# Patient Record
Sex: Male | Born: 1970 | Race: Black or African American | Hispanic: No | Marital: Married | Smoking: Never smoker
Health system: Southern US, Community
[De-identification: ages and names within clinical notes are randomized; demographics above are authoritative.]

## PROBLEM LIST (undated history)

## (undated) DIAGNOSIS — I10 Essential (primary) hypertension: Secondary | ICD-10-CM

## (undated) HISTORY — PX: MASS EXCISION: SHX2000

## (undated) HISTORY — PX: VASECTOMY: SHX75

## (undated) HISTORY — DX: Essential (primary) hypertension: I10

---

## 2007-12-21 ENCOUNTER — Ambulatory Visit: Payer: Self-pay | Admitting: Family Medicine

## 2014-07-12 ENCOUNTER — Emergency Department: Payer: Self-pay | Admitting: Internal Medicine

## 2016-05-26 IMAGING — CR DG SHOULDER 3+V*R*
1 series · 3 of 3 positions shown · non-contrast
Comparison: None.

CLINICAL DATA: Right shoulder pain starting [REDACTED], no known injury

EXAM:
DG SHOULDER 3+ VIEWS RIGHT

[Series 1: dxr shoulder right complete · 0.14mm/px · 3 of 3 slices shown]
[im 1/3]
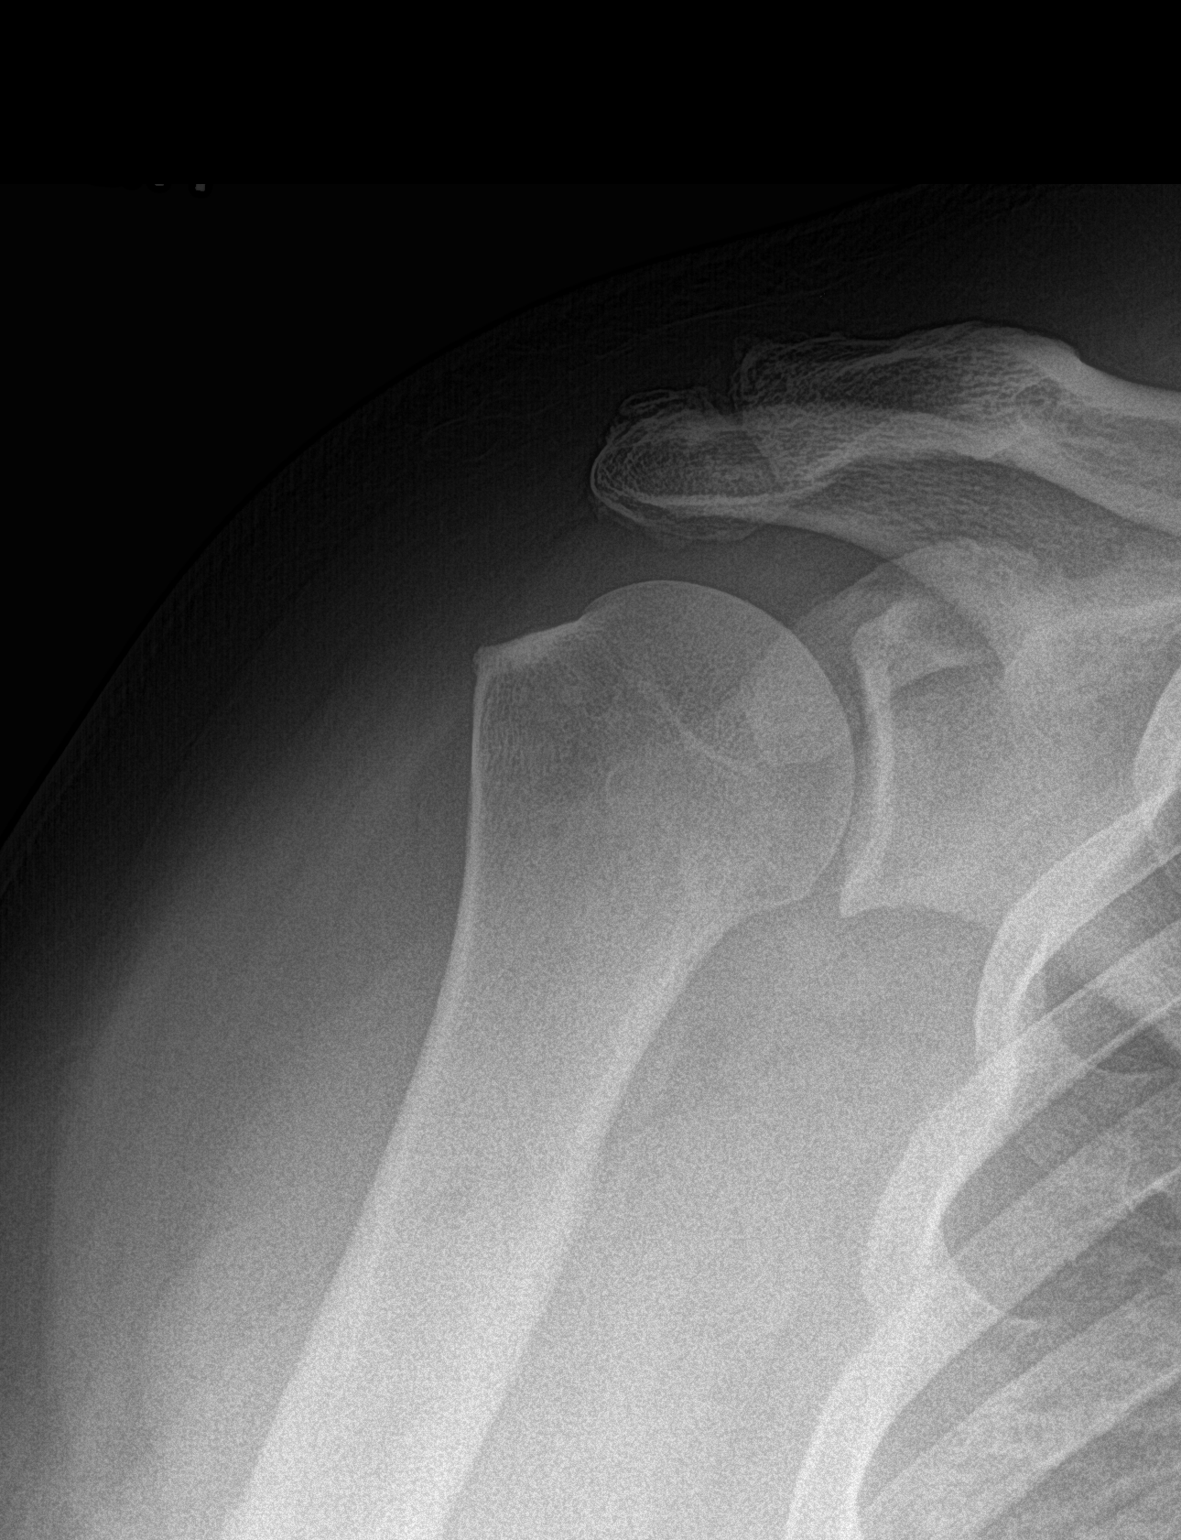
[im 2/3]
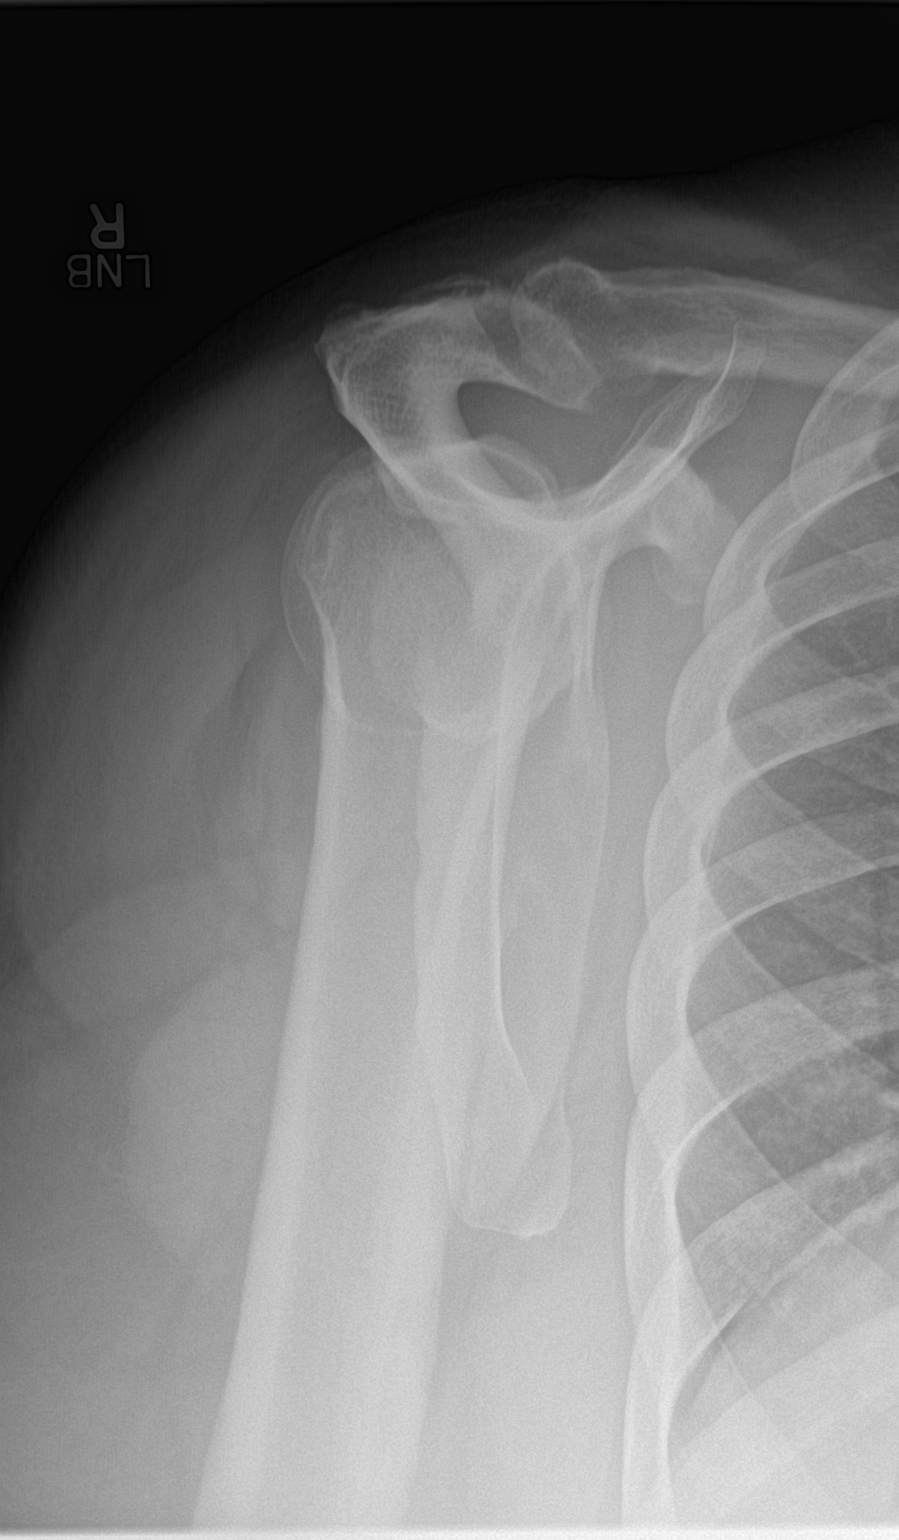
[im 3/3]
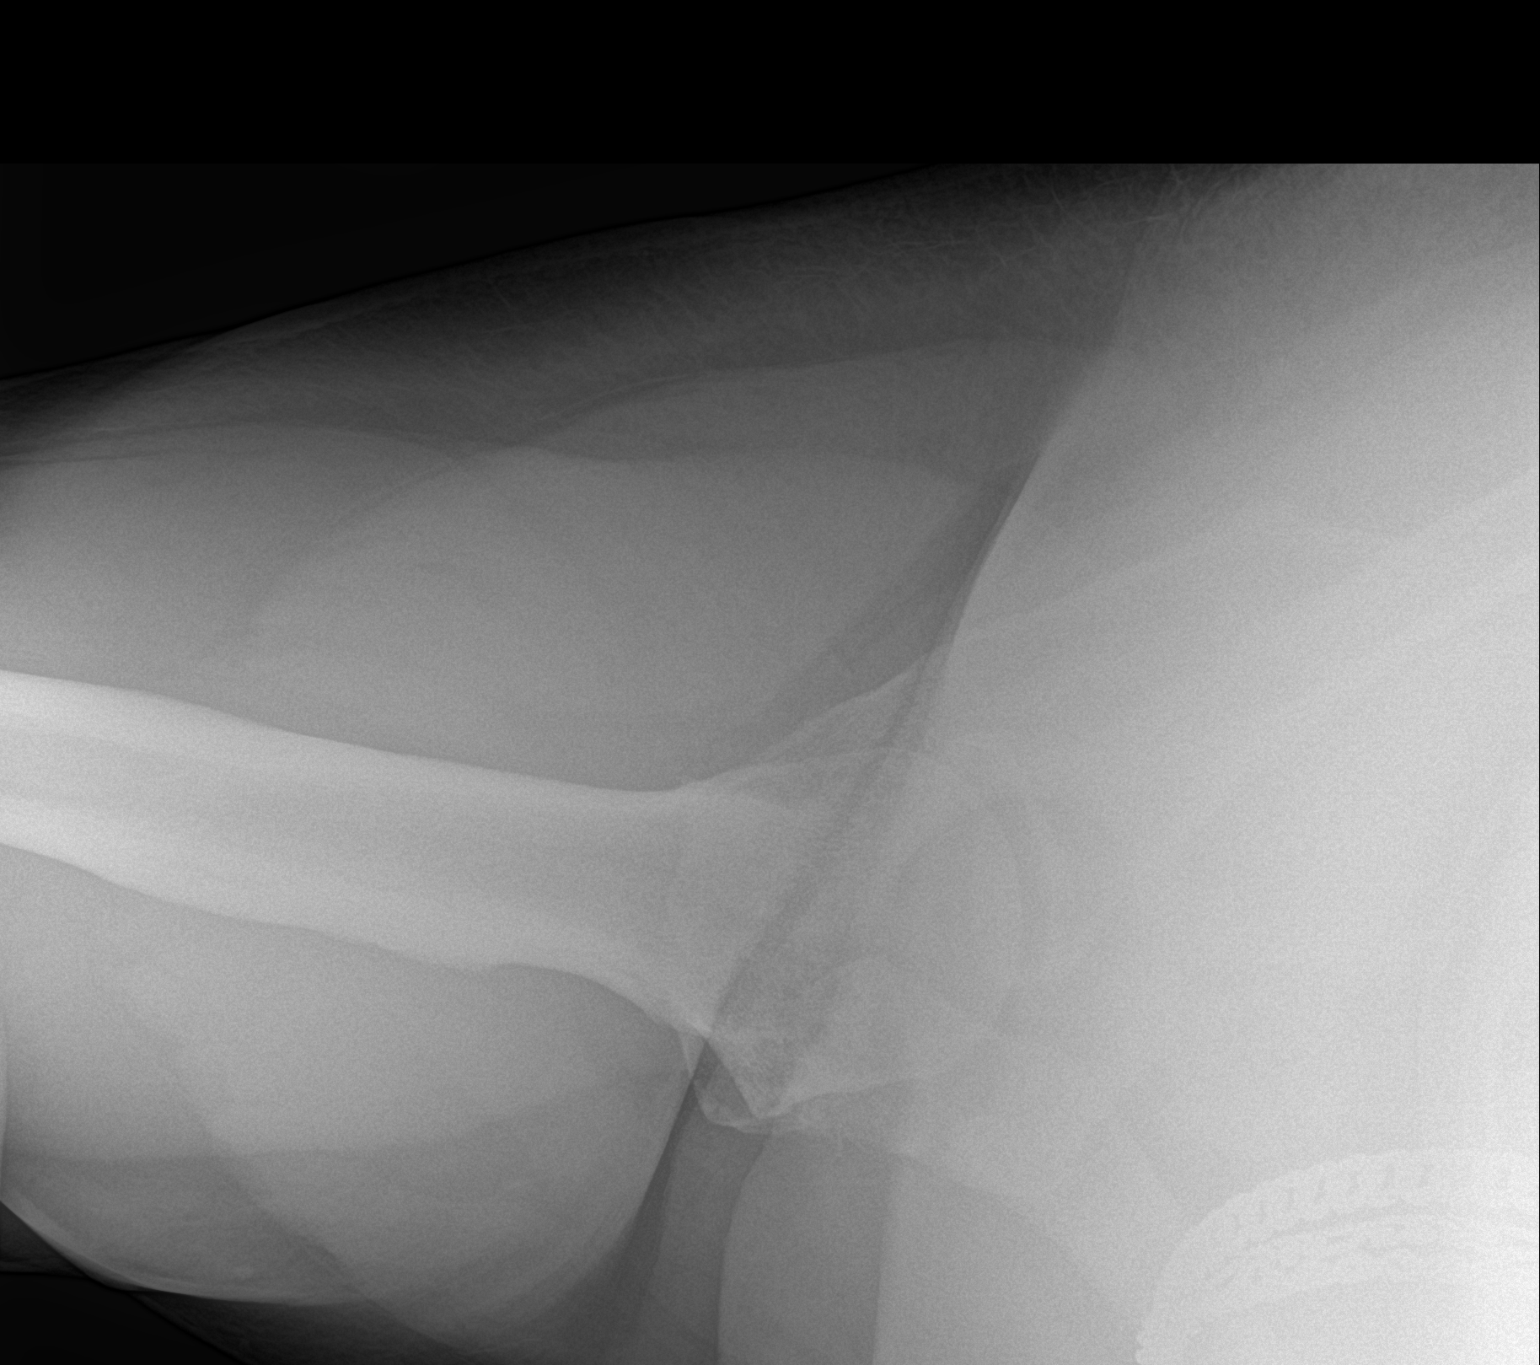

[3 of 3 positions shown; findings below may reference images not displayed]

FINDINGS: Three views of the right shoulder submitted. No acute fracture or
subluxation. Mild degenerative changes acromioclavicular joint.
Glenohumeral joint is preserved. Mild inferior spurring of acromion.
IMPRESSION: No acute fracture or subluxation. Mild degenerative changes as
described above.

## 2017-02-21 DIAGNOSIS — R0789 Other chest pain: Secondary | ICD-10-CM | POA: Insufficient documentation

## 2018-08-14 ENCOUNTER — Encounter: Payer: Self-pay | Admitting: Urology

## 2018-08-14 ENCOUNTER — Ambulatory Visit: Payer: BLUE CROSS/BLUE SHIELD | Admitting: Urology

## 2018-08-14 VITALS — BP 154/83 | HR 70 | Ht 71.0 in | Wt 330.0 lb

## 2018-08-14 DIAGNOSIS — N400 Enlarged prostate without lower urinary tract symptoms: Secondary | ICD-10-CM | POA: Diagnosis not present

## 2018-08-14 DIAGNOSIS — Z125 Encounter for screening for malignant neoplasm of prostate: Secondary | ICD-10-CM

## 2018-08-14 LAB — BLADDER SCAN AMB NON-IMAGING: SCAN RESULT: 46

## 2018-08-14 NOTE — Progress Notes (Signed)
08/14/2018 2:21 PM   Marcus Sullivan Jul 24, 1970 [id number]  Referring provider: Sharyne Peach, MD [place], [place] [address]  Chief Complaint  Patient presents with  . Elevated PSA    HPI:  48 year old male referred for PSA screening.  PSA was 70 in 2015 checked during a UTI.  Follow-up PSA levels have ranged from 1.2-1.99, the patient referred given recent data on increased screening frequency when PSA is over 1.5. His most recent PSA was 1.75.   No FH PCa.   He has occasional frequency. PVR is 42. He drinks a lot of water. Good flow. Noc  0-1. AUASS = 5, mostly satisfied.   Modifying factors: There are no other modifying factors  Associated signs and symptoms: There are no other associated signs and symptoms Aggravating and relieving factors: There are no other aggravating or relieving factors Severity: Moderate Duration: Persistent    PMH: Past Medical History:  Diagnosis Date  . Hypertension     Surgical History:   Home Medications:  Allergies as of 08/14/2018   No Known Allergies     Medication List       Accurate as of August 14, 2018  2:21 PM. Always use your most recent med list.        aspirin EC 81 MG tablet Take by mouth.   Fish Oil 1000 MG Caps Take by mouth.   losartan 100 MG tablet Commonly known as:  COZAAR   vitamin B-12 1000 MCG tablet Commonly known as:  CYANOCOBALAMIN Take by mouth.       Allergies: No Known Allergies  Family History: No family history on file.  Social History:  reports that he has never smoked. He has never used smokeless tobacco. He reports current alcohol use. He reports that he does not use drugs.  ROS: UROLOGY Frequent Urination?: Yes Hard to postpone urination?: No Burning/pain with urination?: No Get up at night to urinate?: No Leakage of urine?: No Urine stream starts and stops?: No Trouble starting stream?: No Do you have to strain to urinate?: No Blood in urine?:  No Urinary tract infection?: No Sexually transmitted disease?: No Injury to kidneys or bladder?: No Painful intercourse?: No Weak stream?: No Erection problems?: No Penile pain?: No  Gastrointestinal Nausea?: No Vomiting?: No Indigestion/heartburn?: No Diarrhea?: No Constipation?: No  Constitutional Fever: No Night sweats?: No Weight loss?: No Fatigue?: No  Skin Skin rash/lesions?: No Itching?: No  Eyes Blurred vision?: No Double vision?: No  Ears/Nose/Throat Sore throat?: No Sinus problems?: No  Hematologic/Lymphatic Swollen glands?: No Easy bruising?: No  Cardiovascular Leg swelling?: No Chest pain?: No  Respiratory Cough?: No Shortness of breath?: No  Endocrine Excessive thirst?: No  Musculoskeletal Back pain?: No Joint pain?: No  Neurological Headaches?: No Dizziness?: No  Psychologic Depression?: No Anxiety?: No  Physical Exam: BP (!) 154/83   Pulse 70   Ht 5\' 11"  (1.803 m)   Wt (!) 149.7 kg   BMI 46.03 kg/m   Constitutional:  Alert and oriented, No acute distress. HEENT: [place] AT, moist mucus membranes.  Trachea midline, no masses. Cardiovascular: No clubbing, cyanosis, or edema. Respiratory: Normal respiratory effort, no increased work of breathing. GI: Abdomen is soft, nontender, nondistended, no abdominal masses GU: No CVA tenderness Lymph: No cervical or inguinal lymphadenopathy. Skin: No rashes, bruises or suspicious lesions. Neurologic: Grossly intact, no focal deficits, moving all 4 extremities. Psychiatric: Normal mood and affect. DRE: prostate 30 g, smooth without hard area or nodule  Laboratory Data: No results found for: WBC, HGB, HCT, MCV, PLT  No results found for: CREATININE  No results found for: PSA  No results found for: TESTOSTERONE  No results found for: HGBA1C  Urinalysis No results found for: COLORURINE, APPEARANCEUR, LABSPEC, PHURINE, GLUCOSEU, HGBUR, BILIRUBINUR, KETONESUR, PROTEINUR, UROBILINOGEN,  NITRITE, LEUKOCYTESUR  No results found for: LABMICR, WBCUA, RBCUA, LABEPIT, MUCUS, BACTERIA  No results found for this or any previous visit. No results found for this or any previous visit. No results found for this or any previous visit. No results found for this or any previous visit. No results found for this or any previous visit. No results found for this or any previous visit. No results found for this or any previous visit. No results found for this or any previous visit.  Assessment & Plan:    1. PSA screening  - overall PSA remains low. Will continue yearly screening.  - Bladder Scan (Post Void Residual) in office  2. BPH - will monitor   No follow-ups on file.  Festus Aloe, MD  [facility] [address] [place], [place] [address] [phone]

## 2019-03-20 ENCOUNTER — Ambulatory Visit: Payer: Self-pay

## 2019-03-20 ENCOUNTER — Other Ambulatory Visit: Payer: Self-pay

## 2019-03-20 VITALS — BP 150/96 | HR 77 | Resp 18 | Ht 72.0 in | Wt 339.0 lb

## 2019-03-20 DIAGNOSIS — Z008 Encounter for other general examination: Secondary | ICD-10-CM

## 2019-03-20 LAB — POCT LIPID PANEL
HDL: 47
LDL: 50
Non-HDL: 68
POC Glucose: 111 mg/dl — AB (ref 70–99)
TC/HDL: 2.5
TC: 115
TRG: 91

## 2019-03-20 NOTE — Progress Notes (Signed)
     Patient ID: Marcus Sullivan, male    DOB: [birth year], 48 y.o.   MRN: [id number]    Thank you!!  Apolonio Schneiders RN  [place] [place]: [phone]  Cell:  [phone] Website: [email or web address]

## 2019-03-28 ENCOUNTER — Other Ambulatory Visit: Payer: Self-pay

## 2019-03-28 ENCOUNTER — Ambulatory Visit: Payer: Self-pay

## 2019-03-28 DIAGNOSIS — Z23 Encounter for immunization: Secondary | ICD-10-CM

## 2019-03-28 NOTE — Progress Notes (Signed)
   Subjective:    Patient ID: Marcus Sullivan, male    DOB: May 08, 1971, 48 y.o.   MRN: [id number]  HPI    Review of Systems     Objective:   Physical Exam  Assessment & Plan:   Wt Readings from Last 3 Encounters:  03/20/19 (!) 339 lb (153.8 kg)  08/14/18 (!) 330 lb (149.7 kg)   Temp Readings from Last 3 Encounters:  No data found for Temp   BP Readings from Last 3 Encounters:  03/20/19 (!) 150/96  08/14/18 (!) 154/83   Pulse Readings from Last 3 Encounters:  03/20/19 77  08/14/18 70     Lab Results  Component Value Date   POCGLU 111 (A) 03/20/2019           Thank you!!  Apolonio Schneiders RN  [place] [facility] [place]: [phone]  Cell:  [phone] Website: [email or web address]

## 2019-03-28 NOTE — Patient Instructions (Signed)
Influenza (Flu) Vaccine (Inactivated or Recombinant): What You Need to Know 1. Why get vaccinated? Influenza vaccine can prevent influenza (flu). Flu is a contagious disease that spreads around the [place] every year, usually between October and May. Anyone can get the flu, but it is more dangerous for some people. Infants and young children, people 50 years of age and older, pregnant women, and people with certain health conditions or a weakened immune system are at greatest risk of flu complications. Pneumonia, bronchitis, sinus infections and ear infections are examples of flu-related complications. If you have a medical condition, such as heart disease, cancer or diabetes, flu can make it worse. Flu can cause fever and chills, sore throat, muscle aches, fatigue, cough, headache, and runny or stuffy nose. Some people may have vomiting and diarrhea, though this is more common in children than adults. Each year thousands of people in the [place] die from flu, and many more are hospitalized. Flu vaccine prevents millions of illnesses and flu-related visits to the doctor each year. 2. Influenza vaccine CDC recommends everyone 6 months of age and older get vaccinated every flu season. Children 6 months through 8 years of age may need 2 doses during a single flu season. Everyone else needs only 1 dose each flu season. It takes about 2 weeks for protection to develop after vaccination. There are many flu viruses, and they are always changing. Each year a new flu vaccine is made to protect against three or four viruses that are likely to cause disease in the upcoming flu season. Even when the vaccine doesn't exactly match these viruses, it may still provide some protection. Influenza vaccine does not cause flu. Influenza vaccine may be given at the same time as other vaccines. 3. Talk with your health care provider Tell your vaccine provider if the person getting the vaccine:  Has had an  allergic reaction after a previous dose of influenza vaccine, or has any severe, life-threatening allergies.  Has ever had Guillain-Barr Syndrome (also called GBS). In some cases, your health care provider may decide to postpone influenza vaccination to a future visit. People with minor illnesses, such as a cold, may be vaccinated. People who are moderately or severely ill should usually wait until they recover before getting influenza vaccine. Your health care provider can give you more information. 4. Risks of a vaccine reaction  Soreness, redness, and swelling where shot is given, fever, muscle aches, and headache can happen after influenza vaccine.  There may be a very small increased risk of Guillain-Barr Syndrome (GBS) after inactivated influenza vaccine (the flu shot). Young children who get the flu shot along with pneumococcal vaccine (PCV13), and/or DTaP vaccine at the same time might be slightly more likely to have a seizure caused by fever. Tell your health care provider if a child who is getting flu vaccine has ever had a seizure. People sometimes faint after medical procedures, including vaccination. Tell your provider if you feel dizzy or have vision changes or ringing in the ears. As with any medicine, there is a very remote chance of a vaccine causing a severe allergic reaction, other serious injury, or death. 5. What if there is a serious problem? An allergic reaction could occur after the vaccinated person leaves the clinic. If you see signs of a severe allergic reaction (hives, swelling of the face and throat, difficulty breathing, a fast heartbeat, dizziness, or weakness), call 9-1-1 and get the person to the nearest hospital. For other signs that   concern you, call your health care provider. Adverse reactions should be reported to the Vaccine Adverse Event Reporting System (VAERS). Your health care provider will usually file this report, or you can do it yourself. Visit the  VAERS website at www.vaers.hhs.gov or call 1-800-822-7967.VAERS is only for reporting reactions, and VAERS staff do not give medical advice. 6. The National Vaccine Injury Compensation Program The National Vaccine Injury Compensation Program (VICP) is a federal program that was created to compensate people who may have been injured by certain vaccines. Visit the VICP website at www.hrsa.gov/vaccinecompensation or call 1-800-338-2382 to learn about the program and about filing a claim. There is a time limit to file a claim for compensation. 7. How can I learn more?  Ask your healthcare provider.  Call your local or state health department.  Contact the Centers for Disease Control and Prevention (CDC): ? Call 1-800-232-4636 (1-800-CDC-INFO) or ? Visit CDC's www.cdc.gov/flu Vaccine Information Statement (Interim) Inactivated Influenza Vaccine (01/31/2018) This information is not intended to replace advice given to you by your health care provider. Make sure you discuss any questions you have with your health care provider. Document Released: 03/30/2006 Document Revised: 09/24/2018 Document Reviewed: 02/04/2018 Elsevier Patient Education  2020 Elsevier Inc. Preventing Influenza, Adult Influenza, more commonly known as "the flu," is a viral infection that mainly affects the respiratory tract. The respiratory tract includes structures that help you breathe, such as the lungs, nose, and throat. The flu causes many common cold symptoms, as well as a high fever and body aches. The flu spreads easily from person to person (is contagious). The flu is most common from December through March. This is called flu season.You can catch the flu virus by:  Breathing in droplets from an infected person's cough or sneeze.  Touching something that was recently contaminated with the virus and then touching your mouth, nose, or eyes. What can I do to lower my risk?        You can decrease your risk of getting  the flu by:  Getting a flu shot (influenza vaccination) every year. This is the best way to prevent the flu. A flu shot is recommended for everyone age 6 months and older. ? It is best to get a flu shot in the fall, as soon as it is available. Getting a flu shot during winter or spring instead is still a good idea. Flu season can last into early spring. ? Preventing the flu through vaccination requires getting a new flu shot every year. This is because the flu virus changes slightly (mutates) from one year to the next. Even if a flu shot does not completely protect you from all flu virus mutations, it can reduce the severity of your illness and prevent dangerous complications of the flu. ? If you are pregnant, you can and should get a flu shot. ? If you have had a reaction to the shot in the past or if you are allergic to eggs, check with your health care provider before getting a flu shot. ? Sometimes the vaccine is available as a nasal spray. In some years, the nasal spray has not been as effective against the flu virus. Check with your health care provider if you have questions about this.  Practicing good health habits. This is especially important during flu season. ? Avoid contact with people who are sick with flu or cold symptoms. ? Wash your hands with soap and water often. If soap and water are not available, use alcohol-based   hand sanitizer. ? Avoid touching your hands to your face, especially when you have not washed your hands recently. ? Use a disinfectant to clean surfaces at home and at work that may be contaminated with the flu virus. ? Keep your body's disease-fighting system (immune system) in good shape by eating a healthy diet, drinking plenty of fluids, getting enough sleep, and exercising regularly. If you do get the flu, avoid spreading it to others by:  Staying home until your symptoms have been gone for at least one day.  Covering your mouth and nose when you cough or  sneeze.  Avoiding close contact with others, especially babies and elderly people. Why are these changes important? Getting a flu shot and practicing good health habits protects you as well as other people. If you get the flu, your friends, family, and co-workers are also at risk of getting it, because it spreads so easily to others. Each year, about 2 out of every 10 people get the flu. Having the flu can lead to complications, such as pneumonia, ear infection, and sinus infection. The flu also can be deadly, especially for babies, people older than age 50, and people who have serious long-term diseases. How is this treated? Most people recover from the flu by resting at home and drinking plenty of fluids. However, a prescription antiviral medicine may reduce your flu symptoms and may make your flu go away sooner. This medicine must be started within a few days of getting flu symptoms. You can talk with your health care provider about whether you need an antiviral medicine. Antiviral medicine may be prescribed for people who are at risk for more serious flu symptoms. This includes people who:  Are older than age 50.  Are pregnant.  Have a condition that makes the flu worse or more dangerous. Where to find more information  Centers for Disease Control and Prevention: www.cdc.gov/flu/index.htm  Flu.gov: www.flu.gov/prevention-vaccination  American Academy of Family Physicians: familydoctor.org/familydoctor/en/kids/vaccines/preventing-the-flu.html Contact a health care provider if:  You have influenza and you develop new symptoms.  You have: ? Chest pain. ? Diarrhea. ? A fever.  Your cough gets worse, or you produce more mucus. Summary  The best way to prevent the flu is to get a flu shot every year in the fall.  Even if you get the flu after you have received the yearly vaccine, your flu may be milder and go away sooner because of your flu shot.  If you get the flu, antiviral  medicines that are started with a few days of symptoms may reduce your flu symptoms and may make your flu go away sooner.  You can also help prevent the flu by practicing good health habits. This information is not intended to replace advice given to you by your health care provider. Make sure you discuss any questions you have with your health care provider. Document Released: 06/20/2015 Document Revised: 05/18/2017 Document Reviewed: 02/12/2016 Elsevier Patient Education  2020 Elsevier Inc.  

## 2019-05-28 ENCOUNTER — Other Ambulatory Visit: Payer: Self-pay | Admitting: Family Medicine

## 2019-05-28 DIAGNOSIS — N400 Enlarged prostate without lower urinary tract symptoms: Secondary | ICD-10-CM

## 2019-05-28 DIAGNOSIS — Z125 Encounter for screening for malignant neoplasm of prostate: Secondary | ICD-10-CM

## 2019-05-30 ENCOUNTER — Other Ambulatory Visit: Payer: BLUE CROSS/BLUE SHIELD

## 2019-06-02 ENCOUNTER — Other Ambulatory Visit: Payer: BC Managed Care – PPO

## 2019-06-02 ENCOUNTER — Other Ambulatory Visit: Payer: Self-pay

## 2019-06-02 DIAGNOSIS — N400 Enlarged prostate without lower urinary tract symptoms: Secondary | ICD-10-CM

## 2019-06-02 DIAGNOSIS — Z125 Encounter for screening for malignant neoplasm of prostate: Secondary | ICD-10-CM

## 2019-06-03 LAB — PSA: Prostate Specific Ag, Serum: 1.4 ng/mL (ref 0.0–4.0)

## 2019-06-04 ENCOUNTER — Ambulatory Visit: Payer: BC Managed Care – PPO | Admitting: Urology

## 2019-06-04 ENCOUNTER — Encounter: Payer: Self-pay | Admitting: Urology

## 2019-06-04 ENCOUNTER — Other Ambulatory Visit: Payer: Self-pay

## 2019-06-04 VITALS — BP 144/88 | HR 74 | Ht 72.0 in | Wt 335.5 lb

## 2019-06-04 DIAGNOSIS — N138 Other obstructive and reflux uropathy: Secondary | ICD-10-CM

## 2019-06-04 DIAGNOSIS — R350 Frequency of micturition: Secondary | ICD-10-CM | POA: Diagnosis not present

## 2019-06-04 DIAGNOSIS — N401 Enlarged prostate with lower urinary tract symptoms: Secondary | ICD-10-CM | POA: Diagnosis not present

## 2019-06-04 NOTE — Patient Instructions (Signed)

## 2019-06-04 NOTE — Progress Notes (Signed)
06/04/2019 3:16 PM   Marcus Sullivan February 01, 1971 [id number]  Referring provider: Sharyne Peach, MD [place],  [place] [address]  No chief complaint on file.   HPI:  F/u -   1) elevated PSA, frequency  - PSA was 70 in 2015 checked during a UTI.  Follow-up PSA levels have ranged from 1.2-1.99, the patient referred given recent data on increased screening frequency when PSA is over 1.5. His PSA was 1.75. DRE normal Feb 2020.    No FH PCa.   He has occasional frequency. PVR was 42. He drinks a lot of water. Good flow. Noc  0-1. AUASS = 5, mostly satisfied.   He returns and PSA remains low at 1.4. He is voiding well. He drinks coffee and water. He has some frequency. No dysuria or gross hematuria.     PMH: Past Medical History:  Diagnosis Date  . Hypertension     Surgical History: Past Surgical History:  Procedure Laterality Date  . MASS EXCISION Right    leg    Home Medications:  Allergies as of 06/04/2019   No Known Allergies     Medication List       Accurate as of June 04, 2019  3:16 PM. If you have any questions, ask your nurse or doctor.        aspirin EC 81 MG tablet Take by mouth.   Fish Oil 1000 MG Caps Take by mouth.   losartan 100 MG tablet Commonly known as: COZAAR   vitamin B-12 1000 MCG tablet Commonly known as: CYANOCOBALAMIN Take by mouth.       Allergies: No Known Allergies  Family History: No family history on file.  Social History:  reports that he has never smoked. He has never used smokeless tobacco. He reports current alcohol use. He reports that he does not use drugs.  ROS:                                        Physical Exam: There were no vitals taken for this visit.  Constitutional:  Alert and oriented, No acute distress. HEENT: [place] AT, moist mucus membranes.  Trachea midline, no masses. Cardiovascular: No clubbing, cyanosis, or edema. Respiratory: Normal respiratory  effort, no increased work of breathing. GI: Abdomen is soft, nontender, nondistended, no abdominal masses GU: No CVA tenderness Lymph: No cervical  Skin: No rashes, bruises or suspicious lesions. Neurologic: Grossly intact, no focal deficits, moving all 4 extremities. Psychiatric: Normal mood and affect. DRE: Prostate 40 g, smooth without hard area or nodule, R > L   Laboratory Data: No results found for: WBC, HGB, HCT, MCV, PLT  No results found for: CREATININE  No results found for: PSA  No results found for: TESTOSTERONE  No results found for: HGBA1C  Urinalysis No results found for: COLORURINE, APPEARANCEUR, LABSPEC, PHURINE, GLUCOSEU, HGBUR, BILIRUBINUR, KETONESUR, PROTEINUR, UROBILINOGEN, NITRITE, LEUKOCYTESUR  No results found for: LABMICR, WBCUA, RBCUA, LABEPIT, MUCUS, BACTERIA  Pertinent Imaging: n/a No results found for this or any previous visit. No results found for this or any previous visit. No results found for this or any previous visit. No results found for this or any previous visit. No results found for this or any previous visit. No results found for this or any previous visit. No results found for this or any previous visit. No results found for this or  any previous visit.  Assessment & Plan:    Bph, frequency - no nocturia. Depends on fluid intake. PSA normal and DRE normal. Continue yearly surveillance.   No follow-ups on file.  Festus Aloe, MD  [facility] [address] [place], [place] [address] [phone]

## 2019-12-30 ENCOUNTER — Other Ambulatory Visit: Payer: Self-pay | Admitting: Family Medicine

## 2019-12-30 DIAGNOSIS — R600 Localized edema: Secondary | ICD-10-CM

## 2019-12-31 ENCOUNTER — Ambulatory Visit
Admission: RE | Admit: 2019-12-31 | Discharge: 2019-12-31 | Disposition: A | Discharge status: Home / Self Care | Payer: BC Managed Care – PPO | Source: Ambulatory Visit | Attending: Family Medicine | Admitting: Family Medicine

## 2019-12-31 ENCOUNTER — Other Ambulatory Visit: Payer: Self-pay

## 2019-12-31 DIAGNOSIS — R600 Localized edema: Secondary | ICD-10-CM | POA: Insufficient documentation

## 2020-01-08 DIAGNOSIS — I890 Lymphedema, not elsewhere classified: Secondary | ICD-10-CM

## 2020-05-31 ENCOUNTER — Other Ambulatory Visit: Payer: Self-pay | Admitting: Urology

## 2020-05-31 DIAGNOSIS — N401 Enlarged prostate with lower urinary tract symptoms: Secondary | ICD-10-CM

## 2020-06-09 ENCOUNTER — Ambulatory Visit: Payer: BC Managed Care – PPO | Admitting: Urology

## 2020-06-09 ENCOUNTER — Encounter: Payer: Self-pay | Admitting: Urology

## 2021-11-14 IMAGING — US US EXTREM LOW VENOUS
1 series · 13 of 24 positions shown · non-contrast
Comparison: None.

CLINICAL DATA: 48-year-old male with lower extremity edema



[Series 1: us extrem low venous · 0.10mm/px · 13 of 49 slices shown]
[im 1/49]
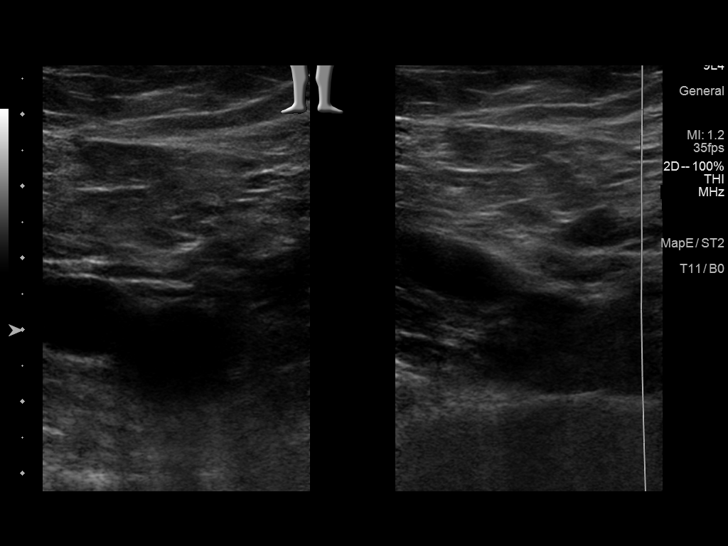
[im 5/49]
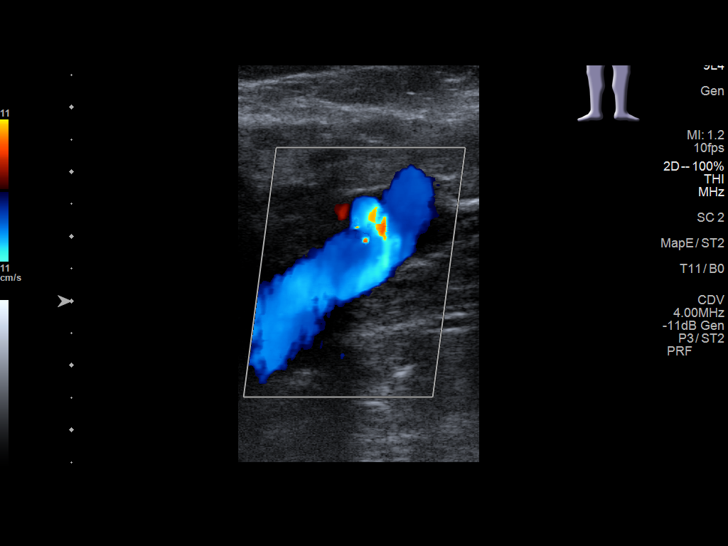
[im 9/49]
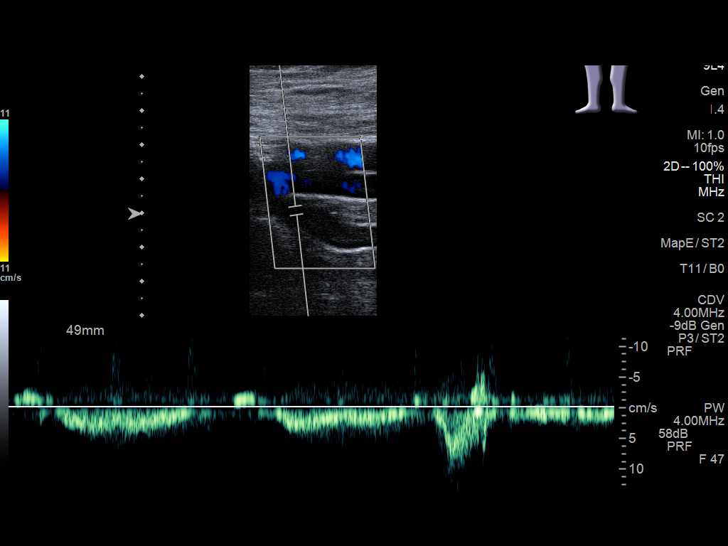
[im 13/49]
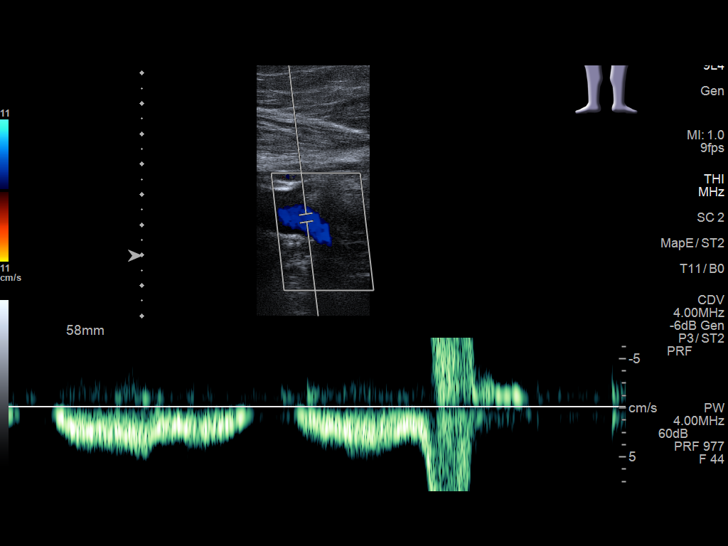
[im 17/49]
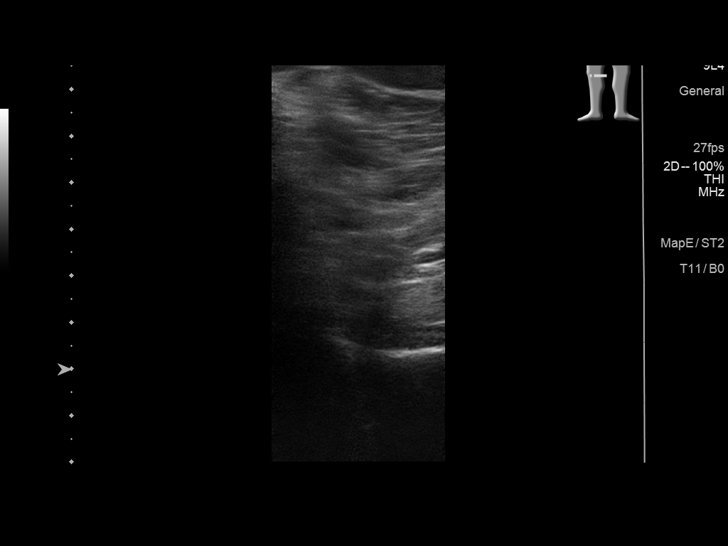
[im 21/49]
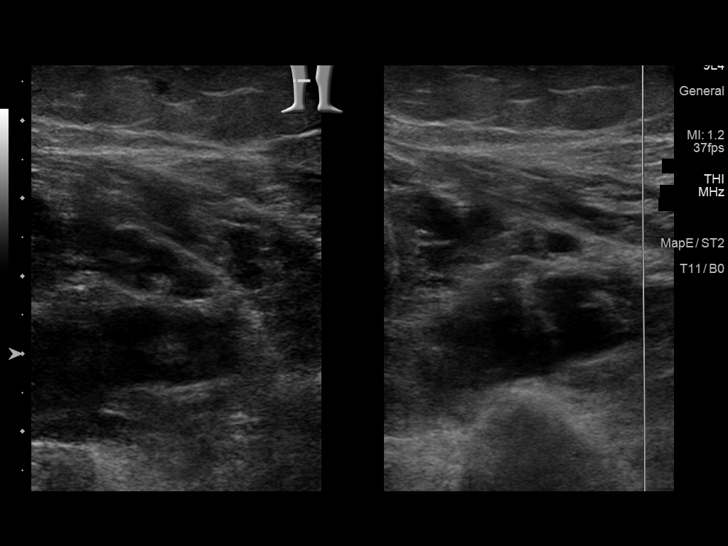
[im 26/49]
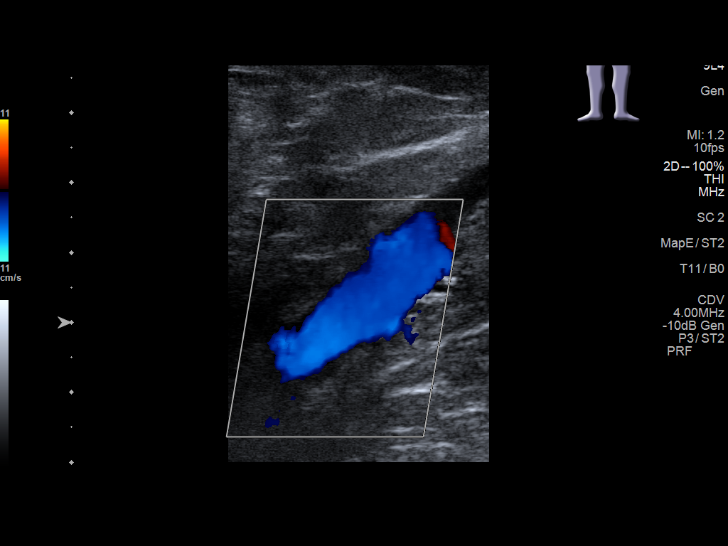
[im 28/49]
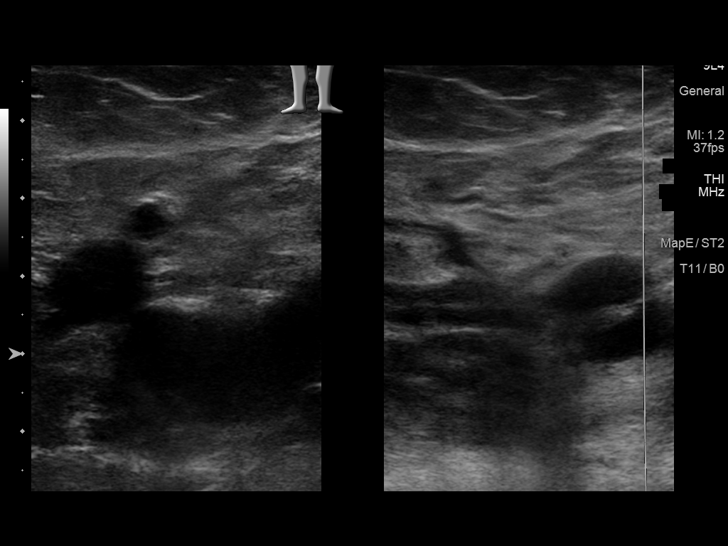
[im 32/49]
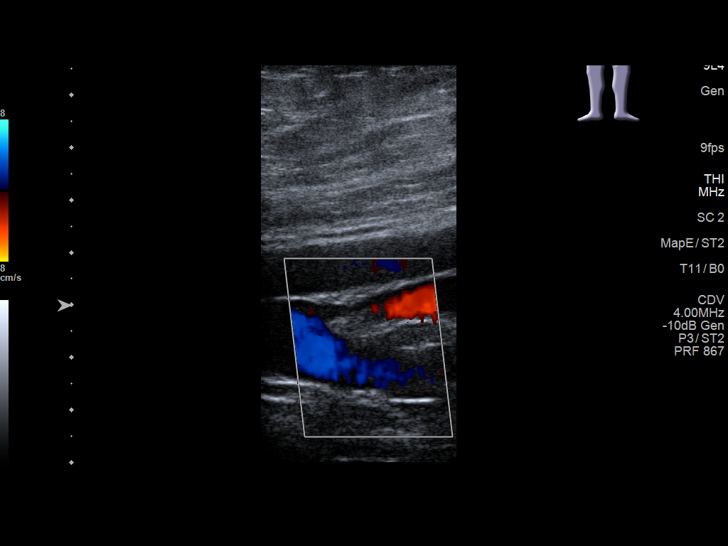
[im 36/49]
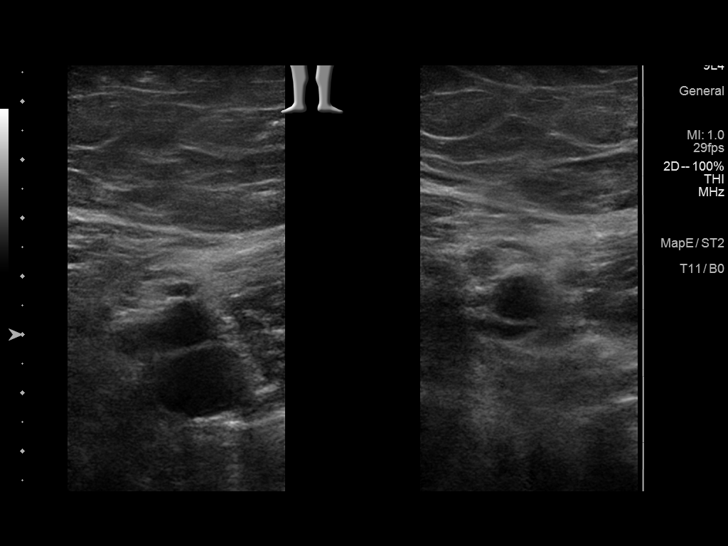
[im 40/49]
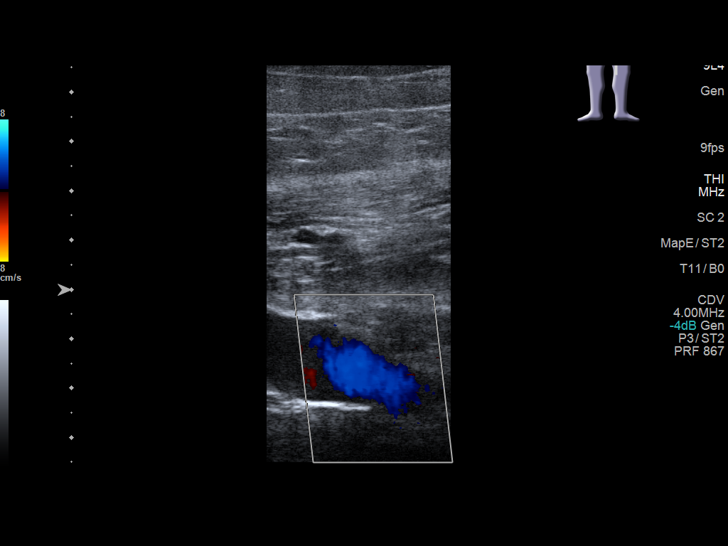
[im 44/49]
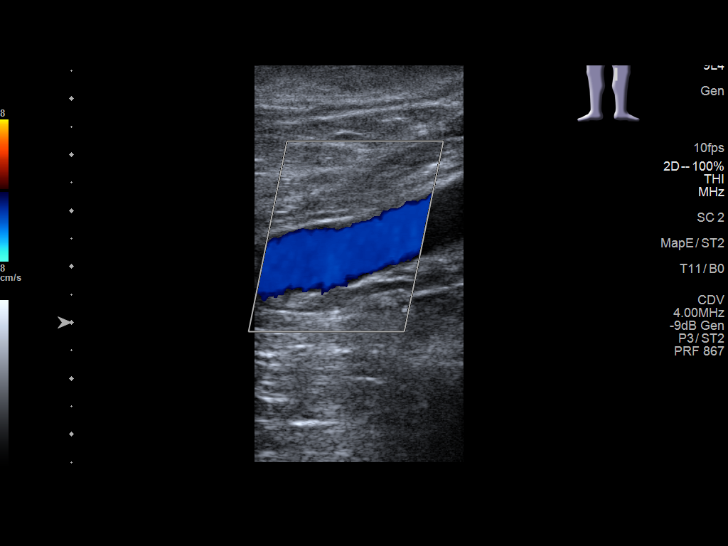
[im 49/49]
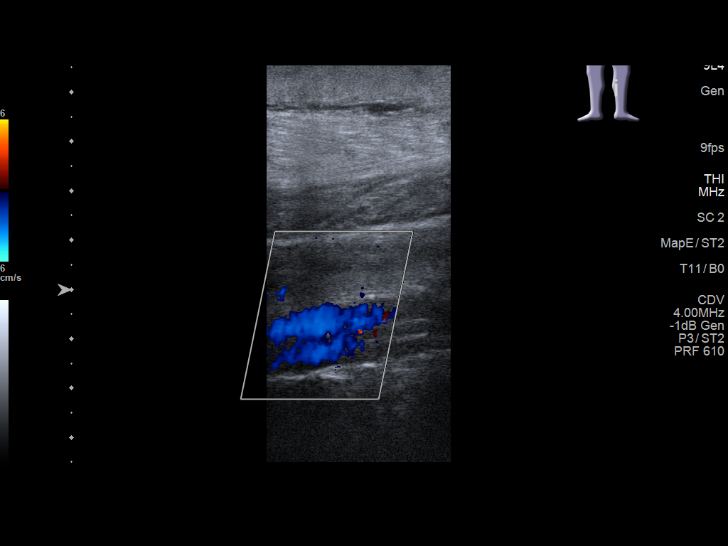

[13 of 24 positions shown; findings below may reference images not displayed]

FINDINGS: RIGHT LOWER EXTREMITY

Common Femoral Vein: No evidence of thrombus. Normal
compressibility, respiratory phasicity and response to augmentation.

Saphenofemoral Junction: No evidence of thrombus. Normal
compressibility and flow on color Doppler imaging.

Profunda Femoral Vein: No evidence of thrombus. Normal
compressibility and flow on color Doppler imaging.

Femoral Vein: No evidence of thrombus. Normal compressibility,
respiratory phasicity and response to augmentation.

Popliteal Vein: No evidence of thrombus. Normal compressibility,
respiratory phasicity and response to augmentation.

Calf Veins: No evidence of thrombus. Normal compressibility and flow
on color Doppler imaging.

Superficial Great Saphenous Vein: No evidence of thrombus. Normal
compressibility and flow on color Doppler imaging.

Other Findings:  None.

LEFT LOWER EXTREMITY

Common Femoral Vein: No evidence of thrombus. Normal
compressibility, respiratory phasicity and response to augmentation.

Saphenofemoral Junction: No evidence of thrombus. Normal
compressibility and flow on color Doppler imaging.

Profunda Femoral Vein: No evidence of thrombus. Normal
compressibility and flow on color Doppler imaging.

Femoral Vein: No evidence of thrombus. Normal compressibility,
respiratory phasicity and response to augmentation.

Popliteal Vein: No evidence of thrombus. Normal compressibility,
respiratory phasicity and response to augmentation.

Calf Veins: No evidence of thrombus. Normal compressibility and flow
on color Doppler imaging.

Superficial Great Saphenous Vein: No evidence of thrombus. Normal
compressibility and flow on color Doppler imaging.

Other Findings:  None.
IMPRESSION: Sonographic survey of the bilateral lower extremities negative for
DVT

## 2022-01-30 ENCOUNTER — Ambulatory Visit
Admission: RE | Admit: 2022-01-30 | Discharge: 2022-01-30 | Disposition: A | Discharge status: Home / Self Care | Payer: BC Managed Care – PPO | Source: Ambulatory Visit | Attending: Family Medicine | Admitting: Family Medicine

## 2022-01-30 ENCOUNTER — Other Ambulatory Visit: Payer: Self-pay | Admitting: Family Medicine

## 2022-01-30 ENCOUNTER — Ambulatory Visit
Admission: RE | Admit: 2022-01-30 | Discharge: 2022-01-30 | Disposition: A | Discharge status: Home / Self Care | Payer: BC Managed Care – PPO | Attending: Family Medicine | Admitting: Family Medicine

## 2022-01-30 DIAGNOSIS — M25561 Pain in right knee: Secondary | ICD-10-CM

## 2022-02-07 ENCOUNTER — Encounter: Payer: Self-pay | Admitting: *Deleted

## 2022-02-10 ENCOUNTER — Other Ambulatory Visit
Admission: RE | Admit: 2022-02-10 | Discharge: 2022-02-10 | Disposition: A | Discharge status: Home / Self Care | Payer: BC Managed Care – PPO | Attending: Urology | Admitting: Urology

## 2022-02-10 ENCOUNTER — Ambulatory Visit: Payer: BC Managed Care – PPO | Admitting: Urology

## 2022-02-10 ENCOUNTER — Encounter: Payer: Self-pay | Admitting: Urology

## 2022-02-10 VITALS — BP 151/84 | HR 74 | Ht 72.0 in | Wt 354.0 lb

## 2022-02-10 DIAGNOSIS — R972 Elevated prostate specific antigen [PSA]: Secondary | ICD-10-CM | POA: Insufficient documentation

## 2022-02-10 DIAGNOSIS — D1801 Hemangioma of skin and subcutaneous tissue: Secondary | ICD-10-CM | POA: Insufficient documentation

## 2022-02-10 DIAGNOSIS — I10 Essential (primary) hypertension: Secondary | ICD-10-CM | POA: Insufficient documentation

## 2022-02-10 LAB — PSA: Prostatic Specific Antigen: 2.6 ng/mL (ref 0.00–4.00)

## 2022-02-10 NOTE — Progress Notes (Signed)
02/10/22 4:11 PM   Marcus Sullivan 08-Jan-1971 [id number]  Referring provider:  Sharyne Peach, MD [place],  [place] [id number] Chief Complaint  Patient presents with   Elevated PSA    New patient      HPI: Marcus Sullivan is a 51 y.o.male who presents today for further evaluation of elevated PSA.   He was previously treated by Dr. Junious Silk   He reportedly had a 3.0 PSA at [place] that was states in referral from Dr Salome Holmes.   He reports that he works at [name] and received labs he underwent those labs on 01/17/2022. He reports that he has some urinary frequency he drinks 1 cup of coffee a day.     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 Right thigh - excised in 2009   Hypertension    Lymphedema of left lower extremity 01/08/2020   Morbid obesity due to excess calories ([place]) 02/10/2022    Surgical History: Past Surgical History:  Procedure Laterality Date   MASS EXCISION Right    leg   VASECTOMY      Home Medications:  Allergies as of 02/10/2022   No Known Allergies      Medication List        Accurate as of February 10, 2022 11:59 PM. If you have any questions, ask your nurse or doctor.          amLODipine 5 MG tablet Commonly known as: NORVASC Take 5 mg by mouth daily.   aspirin EC 81 MG tablet Take by mouth.   cyanocobalamin 1000 MCG tablet Commonly known as: VITAMIN B12 Take by mouth.   Fish Oil 1000 MG Caps Take by mouth.   losartan 100 MG tablet Commonly known as: COZAAR        Allergies:  No Known Allergies  Family History: Family History  Problem Relation Age of Onset   Prostate cancer Neg Hx    Bladder Cancer Neg Hx    Kidney cancer Neg Hx     Social History:  reports that he has never smoked. He has never used smokeless tobacco. He reports  current alcohol use. He reports that he does not use drugs.   Physical Exam: BP (!) 151/84   Pulse 74   Ht 6' (1.829 m)   Wt (!) 354 lb (160.6 kg)   BMI 48.01 kg/m   Constitutional:  Alert and oriented, No acute distress. HEENT: [place] AT, moist mucus membranes.  Trachea midline, no masses. Cardiovascular: No clubbing, cyanosis, or edema. Respiratory: Normal respiratory effort, no increased work of breathing. Skin: No rashes, bruises or suspicious lesions. Rectal: Normal sphincter tone.  Small 20 cc prostate, nontender no nodules. Neurologic: Grossly intact, no focal deficits, moving all 4 extremities. Psychiatric: Normal mood and affect.    Assessment & Plan:   Elevated PSA  -  We reviewed the implications of an elevated PSA and the uncertainty surrounding it. In general, a man's PSA increases with age and is produced by both normal and cancerous prostate tissue. Differential for elevated PSA is BPH, prostate cancer, infection, recent intercourse/ejaculation, prostate infarction, recent urethroscopic manipulation (foley placement/cystoscopy) and prostatitis. Management of an elevated PSA can include observation or prostate biopsy and wediscussed this in detail. We discussed that indications for prostate biopsy are defined by age  and race specific PSA cutoffs as well as a PSA velocity of 0.75/year. - He is not sure how to obtain PSA lab from his workplace  - Will repeat PSA today, plan based on repeat lab  Return for will call with results.  Conley Rolls as a Education administrator for Hollice Espy, MD.,have documented all relevant documentation on the behalf of Hollice Espy, MD,as directed by  Hollice Espy, MD while in the presence of Hollice Espy, MD.  I have reviewed the above documentation for accuracy and completeness, and I agree with the above.   Hollice Espy, MD   [facility] [address], [place], [place] [address] [phone]

## 2022-04-18 ENCOUNTER — Encounter: Payer: BC Managed Care – PPO | Admitting: Urology

## 2022-11-29 ENCOUNTER — Other Ambulatory Visit: Payer: Self-pay | Admitting: Family Medicine

## 2022-11-29 DIAGNOSIS — M7989 Other specified soft tissue disorders: Secondary | ICD-10-CM

## 2022-11-30 ENCOUNTER — Ambulatory Visit
Admission: RE | Admit: 2022-11-30 | Discharge: 2022-11-30 | Disposition: A | Discharge status: Home / Self Care | Payer: BC Managed Care – PPO | Source: Ambulatory Visit | Attending: Family Medicine | Admitting: Family Medicine

## 2022-11-30 DIAGNOSIS — M7989 Other specified soft tissue disorders: Secondary | ICD-10-CM | POA: Diagnosis present
